# Patient Record
Sex: Male | Born: 1980 | Race: White | Hispanic: No | Marital: Single | Smoking: Current every day smoker
Health system: Southern US, Community
[De-identification: ages and names within clinical notes are randomized; demographics above are authoritative.]

## PROBLEM LIST (undated history)

## (undated) HISTORY — PX: KNEE SURGERY: SHX244

---

## 2013-04-17 ENCOUNTER — Encounter (HOSPITAL_COMMUNITY): Payer: Self-pay | Admitting: Radiology

## 2013-04-17 ENCOUNTER — Emergency Department (HOSPITAL_COMMUNITY): Payer: Medicaid Other

## 2013-04-17 ENCOUNTER — Encounter (HOSPITAL_COMMUNITY): Payer: Medicaid Other | Admitting: Anesthesiology

## 2013-04-17 ENCOUNTER — Inpatient Hospital Stay (HOSPITAL_COMMUNITY): Payer: Medicaid Other | Admitting: Anesthesiology

## 2013-04-17 ENCOUNTER — Encounter (HOSPITAL_COMMUNITY): Admission: EM | Disposition: A | Discharge status: Home Health | Payer: Self-pay | Source: Home / Self Care

## 2013-04-17 ENCOUNTER — Inpatient Hospital Stay (HOSPITAL_COMMUNITY): Payer: Medicaid Other

## 2013-04-17 ENCOUNTER — Inpatient Hospital Stay (HOSPITAL_COMMUNITY)
Admission: EM | Admit: 2013-04-17 | Discharge: 2013-04-19 | DRG: 956 | Disposition: A | Discharge status: Home Health | Payer: Medicaid Other | Attending: Orthopedic Surgery | Admitting: Orthopedic Surgery

## 2013-04-17 DIAGNOSIS — S064X9A Epidural hemorrhage with loss of consciousness of unspecified duration, initial encounter: Secondary | ICD-10-CM

## 2013-04-17 DIAGNOSIS — S065XAA Traumatic subdural hemorrhage with loss of consciousness status unknown, initial encounter: Secondary | ICD-10-CM | POA: Diagnosis present

## 2013-04-17 DIAGNOSIS — F1210 Cannabis abuse, uncomplicated: Secondary | ICD-10-CM | POA: Diagnosis present

## 2013-04-17 DIAGNOSIS — S0181XA Laceration without foreign body of other part of head, initial encounter: Secondary | ICD-10-CM

## 2013-04-17 DIAGNOSIS — K029 Dental caries, unspecified: Secondary | ICD-10-CM | POA: Diagnosis present

## 2013-04-17 DIAGNOSIS — S069XAA Unspecified intracranial injury with loss of consciousness status unknown, initial encounter: Secondary | ICD-10-CM

## 2013-04-17 DIAGNOSIS — S02401A Maxillary fracture, unspecified, initial encounter for closed fracture: Secondary | ICD-10-CM | POA: Diagnosis present

## 2013-04-17 DIAGNOSIS — S0292XA Unspecified fracture of facial bones, initial encounter for closed fracture: Secondary | ICD-10-CM

## 2013-04-17 DIAGNOSIS — S069X9A Unspecified intracranial injury with loss of consciousness of unspecified duration, initial encounter: Secondary | ICD-10-CM

## 2013-04-17 DIAGNOSIS — S5291XA Unspecified fracture of right forearm, initial encounter for closed fracture: Secondary | ICD-10-CM

## 2013-04-17 DIAGNOSIS — S5290XA Unspecified fracture of unspecified forearm, initial encounter for closed fracture: Secondary | ICD-10-CM | POA: Diagnosis present

## 2013-04-17 DIAGNOSIS — S72309A Unspecified fracture of shaft of unspecified femur, initial encounter for closed fracture: Principal | ICD-10-CM | POA: Diagnosis present

## 2013-04-17 DIAGNOSIS — S02400A Malar fracture unspecified, initial encounter for closed fracture: Secondary | ICD-10-CM | POA: Diagnosis present

## 2013-04-17 DIAGNOSIS — F17200 Nicotine dependence, unspecified, uncomplicated: Secondary | ICD-10-CM | POA: Diagnosis present

## 2013-04-17 DIAGNOSIS — S02109A Fracture of base of skull, unspecified side, initial encounter for closed fracture: Secondary | ICD-10-CM | POA: Diagnosis present

## 2013-04-17 DIAGNOSIS — S7290XA Unspecified fracture of unspecified femur, initial encounter for closed fracture: Secondary | ICD-10-CM

## 2013-04-17 DIAGNOSIS — S06309A Unspecified focal traumatic brain injury with loss of consciousness of unspecified duration, initial encounter: Secondary | ICD-10-CM

## 2013-04-17 DIAGNOSIS — S020XXA Fracture of vault of skull, initial encounter for closed fracture: Secondary | ICD-10-CM

## 2013-04-17 DIAGNOSIS — S064XAA Epidural hemorrhage with loss of consciousness status unknown, initial encounter: Secondary | ICD-10-CM

## 2013-04-17 DIAGNOSIS — S7291XA Unspecified fracture of right femur, initial encounter for closed fracture: Secondary | ICD-10-CM

## 2013-04-17 DIAGNOSIS — S064X0A Epidural hemorrhage without loss of consciousness, initial encounter: Secondary | ICD-10-CM

## 2013-04-17 DIAGNOSIS — IMO0002 Reserved for concepts with insufficient information to code with codable children: Secondary | ICD-10-CM

## 2013-04-17 DIAGNOSIS — S0291XA Unspecified fracture of skull, initial encounter for closed fracture: Secondary | ICD-10-CM

## 2013-04-17 DIAGNOSIS — S01409A Unspecified open wound of unspecified cheek and temporomandibular area, initial encounter: Secondary | ICD-10-CM | POA: Diagnosis present

## 2013-04-17 DIAGNOSIS — W19XXXA Unspecified fall, initial encounter: Secondary | ICD-10-CM | POA: Diagnosis present

## 2013-04-17 DIAGNOSIS — S0101XA Laceration without foreign body of scalp, initial encounter: Secondary | ICD-10-CM

## 2013-04-17 HISTORY — PX: FEMUR IM NAIL: SHX1597

## 2013-04-17 HISTORY — PX: ORIF RADIAL FRACTURE: SHX5113

## 2013-04-17 LAB — POCT I-STAT, CHEM 8
BUN: 14 mg/dL (ref 6–23)
Calcium, Ion: 1.05 mmol/L — ABNORMAL LOW (ref 1.12–1.23)
Chloride: 105 mEq/L (ref 96–112)
Creatinine, Ser: 0.9 mg/dL (ref 0.50–1.35)
Glucose, Bld: 160 mg/dL — ABNORMAL HIGH (ref 70–99)
HCT: 40 % (ref 39.0–52.0)
Hemoglobin: 13.6 g/dL (ref 13.0–17.0)
Potassium: 3.5 mEq/L — ABNORMAL LOW (ref 3.7–5.3)
SODIUM: 141 meq/L (ref 137–147)
TCO2: 23 mmol/L (ref 0–100)

## 2013-04-17 LAB — COMPREHENSIVE METABOLIC PANEL
ALBUMIN: 3.7 g/dL (ref 3.5–5.2)
ALT: 16 U/L (ref 0–53)
AST: 30 U/L (ref 0–37)
Alkaline Phosphatase: 66 U/L (ref 39–117)
BUN: 13 mg/dL (ref 6–23)
CHLORIDE: 104 meq/L (ref 96–112)
CO2: 22 mEq/L (ref 19–32)
CREATININE: 0.81 mg/dL (ref 0.50–1.35)
Calcium: 8.1 mg/dL — ABNORMAL LOW (ref 8.4–10.5)
GFR calc Af Amer: >90 mL/min (ref >90)
GFR calc non Af Amer: >90 mL/min (ref >90)
Glucose, Bld: 159 mg/dL — ABNORMAL HIGH (ref 70–99)
Potassium: 3.5 mEq/L — ABNORMAL LOW (ref 3.7–5.3)
Sodium: 140 mEq/L (ref 137–147)
Total Protein: 6.3 g/dL (ref 6.0–8.3)

## 2013-04-17 LAB — CBC
HEMATOCRIT: 36.7 % — AB (ref 39.0–52.0)
Hemoglobin: 13.2 g/dL (ref 13.0–17.0)
MCH: 31.7 pg (ref 26.0–34.0)
MCHC: 36 g/dL (ref 30.0–36.0)
MCV: 88 fL (ref 78.0–100.0)
Platelets: 256 10*3/uL (ref 150–400)
RBC: 4.17 MIL/uL — ABNORMAL LOW (ref 4.22–5.81)
RDW: 12.5 % (ref 11.5–15.5)
WBC: 23.5 10*3/uL — ABNORMAL HIGH (ref 4.0–10.5)

## 2013-04-17 LAB — SAMPLE TO BLOOD BANK

## 2013-04-17 LAB — PROTIME-INR
INR: 1.37 (ref 0.00–1.49)
Prothrombin Time: 16.5 seconds — ABNORMAL HIGH (ref 11.6–15.2)

## 2013-04-17 LAB — CG4 I-STAT (LACTIC ACID): LACTIC ACID, VENOUS: 0.84 mmol/L (ref 0.5–2.2)

## 2013-04-17 SURGERY — INSERTION, INTRAMEDULLARY ROD, FEMUR
Anesthesia: General | Site: Leg Upper | Laterality: Right

## 2013-04-17 MED ORDER — METOCLOPRAMIDE HCL 5 MG/ML IJ SOLN: 5.0000 mg | Freq: Three times a day (TID) | INTRAMUSCULAR | Status: DC | PRN

## 2013-04-17 MED ORDER — ARTIFICIAL TEARS OP OINT
TOPICAL_OINTMENT | OPHTHALMIC | Status: DC | PRN
  Administered 2013-04-17: 1 via OPHTHALMIC

## 2013-04-17 MED ORDER — SODIUM CHLORIDE 0.9 % IV SOLN
INTRAVENOUS | Status: DC | PRN
  Administered 2013-04-17: 05:00:00 via INTRAVENOUS

## 2013-04-17 MED ORDER — SUCCINYLCHOLINE CHLORIDE 20 MG/ML IJ SOLN
INTRAMUSCULAR | Status: DC | PRN
  Administered 2013-04-17: 140 mg via INTRAVENOUS

## 2013-04-17 MED ORDER — ONDANSETRON HCL 4 MG/2ML IJ SOLN
4.0000 mg | Freq: Four times a day (QID) | INTRAMUSCULAR | Status: DC | PRN
  Administered 2013-04-17: 4 mg via INTRAVENOUS
  Filled 2013-04-17: qty 2

## 2013-04-17 MED ORDER — ONDANSETRON HCL 4 MG PO TABS: 4.0000 mg | ORAL_TABLET | Freq: Four times a day (QID) | ORAL | Status: DC | PRN

## 2013-04-17 MED ORDER — WARFARIN - PHARMACIST DOSING INPATIENT: Freq: Every day | Status: DC

## 2013-04-17 MED ORDER — CEFAZOLIN SODIUM-DEXTROSE 2-3 GM-% IV SOLR
INTRAVENOUS | Status: AC
  Filled 2013-04-17: qty 50

## 2013-04-17 MED ORDER — DEXTROSE 5 % IV SOLN
INTRAVENOUS | Status: DC | PRN
  Administered 2013-04-17: 05:00:00 via INTRAVENOUS

## 2013-04-17 MED ORDER — WARFARIN VIDEO: Freq: Once | Status: DC

## 2013-04-17 MED ORDER — OXYCODONE HCL 5 MG/5ML PO SOLN: 5.0000 mg | Freq: Once | ORAL | Status: AC | PRN

## 2013-04-17 MED ORDER — LIDOCAINE HCL (CARDIAC) 20 MG/ML IV SOLN
INTRAVENOUS | Status: DC | PRN
  Administered 2013-04-17: 100 mg via INTRAVENOUS

## 2013-04-17 MED ORDER — ONDANSETRON HCL 4 MG/2ML IJ SOLN
INTRAMUSCULAR | Status: AC
  Administered 2013-04-17: 4 mg
  Filled 2013-04-17: qty 2

## 2013-04-17 MED ORDER — FENTANYL CITRATE 0.05 MG/ML IJ SOLN
50.0000 ug | INTRAMUSCULAR | Status: DC | PRN
  Administered 2013-04-17 (×3): 50 ug via INTRAVENOUS
  Filled 2013-04-17: qty 2

## 2013-04-17 MED ORDER — CEFAZOLIN SODIUM-DEXTROSE 2-3 GM-% IV SOLR
INTRAVENOUS | Status: DC | PRN
  Administered 2013-04-17: 2 g via INTRAVENOUS

## 2013-04-17 MED ORDER — ONDANSETRON HCL 4 MG/2ML IJ SOLN
INTRAMUSCULAR | Status: DC | PRN
  Administered 2013-04-17: 4 mg via INTRAVENOUS

## 2013-04-17 MED ORDER — COUMADIN BOOK
Freq: Once | Status: DC
  Filled 2013-04-17: qty 1

## 2013-04-17 MED ORDER — HYDROMORPHONE HCL PF 1 MG/ML IJ SOLN: 1.0000 mg | INTRAMUSCULAR | Status: DC | PRN

## 2013-04-17 MED ORDER — ONDANSETRON HCL 4 MG/2ML IJ SOLN: 4.0000 mg | Freq: Four times a day (QID) | INTRAMUSCULAR | Status: DC | PRN

## 2013-04-17 MED ORDER — LACTATED RINGERS IV SOLN
INTRAVENOUS | Status: DC | PRN
  Administered 2013-04-17: 07:00:00 via INTRAVENOUS

## 2013-04-17 MED ORDER — HYDROMORPHONE HCL PF 1 MG/ML IJ SOLN
INTRAMUSCULAR | Status: AC
Start: 2013-04-17 — End: 2013-04-17
  Administered 2013-04-17: 0.5 mg via INTRAVENOUS
  Filled 2013-04-17: qty 1

## 2013-04-17 MED ORDER — CEFAZOLIN SODIUM 1-5 GM-% IV SOLN
1.0000 g | Freq: Four times a day (QID) | INTRAVENOUS | Status: AC
  Administered 2013-04-17 (×3): 1 g via INTRAVENOUS
  Filled 2013-04-17 (×4): qty 50

## 2013-04-17 MED ORDER — OXYCODONE HCL 5 MG PO TABS
5.0000 mg | ORAL_TABLET | Freq: Once | ORAL | Status: AC | PRN
  Administered 2013-04-17: 5 mg via ORAL

## 2013-04-17 MED ORDER — BUPIVACAINE HCL (PF) 0.5 % IJ SOLN
INTRAMUSCULAR | Status: AC
  Filled 2013-04-17: qty 10

## 2013-04-17 MED ORDER — IOHEXOL 300 MG/ML  SOLN
100.0000 mL | Freq: Once | INTRAMUSCULAR | Status: AC | PRN
  Administered 2013-04-17: 100 mL via INTRAVENOUS

## 2013-04-17 MED ORDER — PROPOFOL 10 MG/ML IV BOLUS
INTRAVENOUS | Status: DC | PRN
  Administered 2013-04-17 (×2): 30 mg via INTRAVENOUS
  Administered 2013-04-17: 120 mg via INTRAVENOUS

## 2013-04-17 MED ORDER — SODIUM CHLORIDE 0.9 % IV BOLUS (SEPSIS)
125.0000 mL | Freq: Once | INTRAVENOUS | Status: AC
  Administered 2013-04-17: 125 mL via INTRAVENOUS

## 2013-04-17 MED ORDER — VECURONIUM BROMIDE 10 MG IV SOLR
INTRAVENOUS | Status: DC | PRN
  Administered 2013-04-17: 6 mg via INTRAVENOUS
  Administered 2013-04-17: 2 mg via INTRAVENOUS

## 2013-04-17 MED ORDER — ONDANSETRON HCL 4 MG/2ML IJ SOLN: 4.0000 mg | Freq: Once | INTRAMUSCULAR | Status: DC | PRN

## 2013-04-17 MED ORDER — BUPIVACAINE HCL (PF) 0.25 % IJ SOLN
INTRAMUSCULAR | Status: DC | PRN
Start: 2013-04-17 — End: 2013-04-17
  Administered 2013-04-17: 10 mL

## 2013-04-17 MED ORDER — TETANUS-DIPHTH-ACELL PERTUSSIS 5-2.5-18.5 LF-MCG/0.5 IM SUSP
0.5000 mL | Freq: Once | INTRAMUSCULAR | Status: AC
  Administered 2013-04-17: 0.5 mL via INTRAMUSCULAR
  Filled 2013-04-17: qty 0.5

## 2013-04-17 MED ORDER — GLYCOPYRROLATE 0.2 MG/ML IJ SOLN
INTRAMUSCULAR | Status: DC | PRN
  Administered 2013-04-17: 0.4 mg via INTRAVENOUS

## 2013-04-17 MED ORDER — WARFARIN SODIUM 5 MG PO TABS
5.0000 mg | ORAL_TABLET | Freq: Once | ORAL | Status: DC
  Filled 2013-04-17: qty 1

## 2013-04-17 MED ORDER — POTASSIUM CHLORIDE IN NACL 20-0.45 MEQ/L-% IV SOLN
INTRAVENOUS | Status: DC
  Administered 2013-04-17: 100 mL/h via INTRAVENOUS
  Administered 2013-04-17 – 2013-04-18 (×2): via INTRAVENOUS
  Filled 2013-04-17 (×5): qty 1000

## 2013-04-17 MED ORDER — NEOSTIGMINE METHYLSULFATE 1 MG/ML IJ SOLN
INTRAMUSCULAR | Status: DC | PRN
  Administered 2013-04-17: 2 mg via INTRAVENOUS

## 2013-04-17 MED ORDER — BUPIVACAINE HCL (PF) 0.25 % IJ SOLN
INTRAMUSCULAR | Status: AC
  Filled 2013-04-17: qty 30

## 2013-04-17 MED ORDER — PANTOPRAZOLE SODIUM 40 MG IV SOLR
40.0000 mg | Freq: Every day | INTRAVENOUS | Status: DC
  Administered 2013-04-17: 40 mg via INTRAVENOUS
  Filled 2013-04-17 (×4): qty 40

## 2013-04-17 MED ORDER — DEXAMETHASONE SODIUM PHOSPHATE 10 MG/ML IJ SOLN
INTRAMUSCULAR | Status: DC | PRN
  Administered 2013-04-17: 8 mg via INTRAVENOUS

## 2013-04-17 MED ORDER — TETANUS-DIPHTHERIA TOXOIDS TD 5-2 LFU IM INJ: 0.5000 mL | INJECTION | Freq: Once | INTRAMUSCULAR | Status: DC

## 2013-04-17 MED ORDER — MEPERIDINE HCL 25 MG/ML IJ SOLN: 6.2500 mg | INTRAMUSCULAR | Status: DC | PRN

## 2013-04-17 MED ORDER — OXYCODONE HCL 5 MG PO TABS
ORAL_TABLET | ORAL | Status: AC
Start: 2013-04-17 — End: 2013-04-17
  Administered 2013-04-17: 5 mg via ORAL
  Filled 2013-04-17: qty 1

## 2013-04-17 MED ORDER — SODIUM CHLORIDE 0.9 % IV SOLN: INTRAVENOUS | Status: DC

## 2013-04-17 MED ORDER — PANTOPRAZOLE SODIUM 40 MG PO TBEC
40.0000 mg | DELAYED_RELEASE_TABLET | Freq: Every day | ORAL | Status: DC
  Administered 2013-04-18 – 2013-04-19 (×2): 40 mg via ORAL
  Filled 2013-04-17 (×2): qty 1

## 2013-04-17 MED ORDER — FENTANYL CITRATE 0.05 MG/ML IJ SOLN
INTRAMUSCULAR | Status: AC
  Administered 2013-04-17: 50 ug via INTRAVENOUS
  Filled 2013-04-17: qty 2

## 2013-04-17 MED ORDER — HYDROMORPHONE HCL PF 1 MG/ML IJ SOLN
0.2500 mg | INTRAMUSCULAR | Status: DC | PRN
  Administered 2013-04-17 (×2): 0.5 mg via INTRAVENOUS

## 2013-04-17 MED ORDER — FENTANYL CITRATE 0.05 MG/ML IJ SOLN
INTRAMUSCULAR | Status: DC | PRN
  Administered 2013-04-17: 100 ug via INTRAVENOUS
  Administered 2013-04-17: 150 ug via INTRAVENOUS
  Administered 2013-04-17 (×3): 50 ug via INTRAVENOUS
  Administered 2013-04-17 (×2): 100 ug via INTRAVENOUS

## 2013-04-17 MED ORDER — 0.9 % SODIUM CHLORIDE (POUR BTL) OPTIME
TOPICAL | Status: DC | PRN
  Administered 2013-04-17: 120 mL
  Administered 2013-04-17: 200 mL

## 2013-04-17 MED ORDER — METOCLOPRAMIDE HCL 10 MG PO TABS: 5.0000 mg | ORAL_TABLET | Freq: Three times a day (TID) | ORAL | Status: DC | PRN

## 2013-04-17 MED ORDER — MIDAZOLAM HCL 5 MG/5ML IJ SOLN
INTRAMUSCULAR | Status: DC | PRN
  Administered 2013-04-17: 2 mg via INTRAVENOUS

## 2013-04-17 MED ORDER — OXYCODONE-ACETAMINOPHEN 5-325 MG PO TABS
1.0000 | ORAL_TABLET | ORAL | Status: DC | PRN
  Administered 2013-04-17: 1 via ORAL
  Administered 2013-04-17 – 2013-04-18 (×2): 2 via ORAL
  Administered 2013-04-18 (×2): 1 via ORAL
  Administered 2013-04-18: 2 via ORAL
  Administered 2013-04-19: 1 via ORAL
  Filled 2013-04-17 (×2): qty 1
  Filled 2013-04-17 (×3): qty 2
  Filled 2013-04-17 (×2): qty 1

## 2013-04-17 SURGICAL SUPPLY — 84 items
BANDAGE ELASTIC 3 VELCRO ST LF (GAUZE/BANDAGES/DRESSINGS) ×4 IMPLANT
BANDAGE ELASTIC 4 VELCRO ST LF (GAUZE/BANDAGES/DRESSINGS) ×4 IMPLANT
BANDAGE GAUZE ELAST BULKY 4 IN (GAUZE/BANDAGES/DRESSINGS) ×8 IMPLANT
BIT DRILL 2.5X2.75 QC CALB (BIT) ×3 IMPLANT
BIT DRILL LONG 4.0 (BIT) ×1 IMPLANT
BIT DRILL SHORT 4.0 (BIT) ×1 IMPLANT
BLADE SURG 15 STRL LF DISP TIS (BLADE) ×2 IMPLANT
BLADE SURG 15 STRL SS (BLADE) ×4
BNDG CMPR 9X4 STRL LF SNTH (GAUZE/BANDAGES/DRESSINGS) ×2
BNDG ESMARK 4X9 LF (GAUZE/BANDAGES/DRESSINGS) ×4 IMPLANT
BNDG GAUZE ELAST 4 BULKY (GAUZE/BANDAGES/DRESSINGS) ×2 IMPLANT
CLOSURE WOUND 1/2 X4 (GAUZE/BANDAGES/DRESSINGS)
CLOTH BEACON ORANGE TIMEOUT ST (SAFETY) ×8 IMPLANT
CORDS BIPOLAR (ELECTRODE) IMPLANT
COVER SURGICAL LIGHT HANDLE (MISCELLANEOUS) ×8 IMPLANT
COVER TABLE BACK 60X90 (DRAPES) ×4 IMPLANT
CUFF TOURNIQUET SINGLE 18IN (TOURNIQUET CUFF) ×4 IMPLANT
DRAPE C-ARM 42X72 X-RAY (DRAPES) ×4 IMPLANT
DRAPE OEC MINIVIEW 54X84 (DRAPES) IMPLANT
DRAPE STERI IOBAN 125X83 (DRAPES) ×4 IMPLANT
DRAPE SURG 17X23 STRL (DRAPES) ×4 IMPLANT
DRILL BIT LONG 4.0 (BIT) ×4
DRILL BIT SHORT 4.0 (BIT) ×4
DRSG ADAPTIC 3X8 NADH LF (GAUZE/BANDAGES/DRESSINGS) ×4 IMPLANT
DRSG MEPILEX BORDER 4X4 (GAUZE/BANDAGES/DRESSINGS) ×4 IMPLANT
DRSG MEPILEX BORDER 4X8 (GAUZE/BANDAGES/DRESSINGS) ×4 IMPLANT
DRSG PAD ABDOMINAL 8X10 ST (GAUZE/BANDAGES/DRESSINGS) ×3 IMPLANT
DURAPREP 26ML APPLICATOR (WOUND CARE) ×4 IMPLANT
ELECT REM PT RETURN 9FT ADLT (ELECTROSURGICAL) ×4
ELECTRODE REM PT RTRN 9FT ADLT (ELECTROSURGICAL) ×2 IMPLANT
EVACUATOR 1/8 PVC DRAIN (DRAIN) IMPLANT
GAUZE XEROFORM 1X8 LF (GAUZE/BANDAGES/DRESSINGS) ×4 IMPLANT
GAUZE XEROFORM 5X9 LF (GAUZE/BANDAGES/DRESSINGS) ×3 IMPLANT
GLOVE BIO SURGEON STRL SZ8.5 (GLOVE) ×4 IMPLANT
GLOVE BIOGEL PI IND STRL 9 (GLOVE) ×2 IMPLANT
GLOVE BIOGEL PI INDICATOR 9 (GLOVE) ×2
GLOVE SURG ORTHO 9.0 STRL STRW (GLOVE) ×4 IMPLANT
GOWN PREVENTION PLUS XLARGE (GOWN DISPOSABLE) ×4 IMPLANT
GOWN SRG XL XLNG 56XLVL 4 (GOWN DISPOSABLE) ×6 IMPLANT
GOWN STRL NON-REIN LRG LVL3 (GOWN DISPOSABLE) ×4 IMPLANT
GOWN STRL NON-REIN XL XLG LVL4 (GOWN DISPOSABLE) ×12
GUIDE PIN 3.2MM (MISCELLANEOUS) ×4
GUIDE PIN ORTH 343X3.2XBRAD (MISCELLANEOUS) ×1 IMPLANT
GUIDE ROD 3.0 (MISCELLANEOUS) ×4
KIT BASIN OR (CUSTOM PROCEDURE TRAY) ×8 IMPLANT
KIT ROOM TURNOVER OR (KITS) ×8 IMPLANT
MANIFOLD NEPTUNE II (INSTRUMENTS) ×8 IMPLANT
NAIL TAN R 10X38 (Nail) ×3 IMPLANT
NDL HYPO 25GX1X1/2 BEV (NEEDLE) IMPLANT
NEEDLE HYPO 25GX1X1/2 BEV (NEEDLE) IMPLANT
NS IRRIG 1000ML POUR BTL (IV SOLUTION) ×8 IMPLANT
PACK GENERAL/GYN (CUSTOM PROCEDURE TRAY) ×4 IMPLANT
PACK ORTHO EXTREMITY (CUSTOM PROCEDURE TRAY) ×4 IMPLANT
PAD ARMBOARD 7.5X6 YLW CONV (MISCELLANEOUS) ×16 IMPLANT
PAD CAST 3X4 CTTN HI CHSV (CAST SUPPLIES) ×2 IMPLANT
PAD CAST 4YDX4 CTTN HI CHSV (CAST SUPPLIES) ×2 IMPLANT
PADDING CAST ABS 4INX4YD NS (CAST SUPPLIES) ×2
PADDING CAST ABS COTTON 4X4 ST (CAST SUPPLIES) IMPLANT
PADDING CAST COTTON 3X4 STRL (CAST SUPPLIES) ×4
PADDING CAST COTTON 4X4 STRL (CAST SUPPLIES) ×4
PENCIL BUTTON HOLSTER BLD 10FT (ELECTRODE) IMPLANT
PLATE LOCK COMP 6H FOOT (Plate) ×2 IMPLANT
ROD GUIDE 3.0 (MISCELLANEOUS) ×1 IMPLANT
SCREW CORTICAL 3.5MM  16MM (Screw) ×4 IMPLANT
SCREW CORTICAL 3.5MM 16MM (Screw) IMPLANT
SCREW CORTICAL 3.5MM 18MM (Screw) ×12 IMPLANT
SCREW TRIGEN LOW PROF 5.0X35 (Screw) ×3 IMPLANT
SCREW TRIGEN LOW PROF 5.0X60 (Screw) ×2 IMPLANT
SPLINT FIBERGLASS 4X15 (CAST SUPPLIES) ×2 IMPLANT
SPONGE GAUZE 4X4 12PLY (GAUZE/BANDAGES/DRESSINGS) ×4 IMPLANT
SPONGE LAP 4X18 X RAY DECT (DISPOSABLE) IMPLANT
STAPLER VISISTAT 35W (STAPLE) ×2 IMPLANT
STRIP CLOSURE SKIN 1/2X4 (GAUZE/BANDAGES/DRESSINGS) IMPLANT
SUT PROLENE 3 0 PS 2 (SUTURE) IMPLANT
SUT VIC AB 2-0 CTB1 (SUTURE) IMPLANT
SUT VIC AB 2-0 FS1 27 (SUTURE) ×2 IMPLANT
SUT VICRYL 4-0 PS2 18IN ABS (SUTURE) IMPLANT
SYR CONTROL 10ML LL (SYRINGE) IMPLANT
TOWEL OR 17X24 6PK STRL BLUE (TOWEL DISPOSABLE) ×4 IMPLANT
TOWEL OR 17X26 10 PK STRL BLUE (TOWEL DISPOSABLE) ×4 IMPLANT
TUBE CONNECTING 12'X1/4 (SUCTIONS)
TUBE CONNECTING 12X1/4 (SUCTIONS) IMPLANT
UNDERPAD 30X30 INCONTINENT (UNDERPADS AND DIAPERS) ×4 IMPLANT
WATER STERILE IRR 1000ML POUR (IV SOLUTION) ×12 IMPLANT
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
## 2013-04-17 NOTE — Transfer of Care (Signed)
Immediate Anesthesia Transfer of Care Note  Patient: Lucas Acosta  Procedure(s) Performed: Procedure(s): INTRAMEDULLARY (IM) NAIL FEMORAL (Right) OPEN REDUCTION INTERNAL FIXATION (ORIF) RADIAL FRACTURE (Right)  Patient Location: PACU  Anesthesia Type:General  Level of Consciousness: sedated  Airway & Oxygen Therapy: Patient Spontanous Breathing and Patient connected to nasal cannula oxygen  Post-op Assessment: Report given to PACU RN and Post -op Vital signs reviewed and stable  Post vital signs: Reviewed and stable  Complications: No apparent anesthesia complications

## 2013-04-17 NOTE — Preoperative (Signed)
Beta Blockers   Reason not to administer Beta Blockers:Not Applicable 
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
## 2013-04-17 NOTE — ED Provider Notes (Signed)
Lucas Acosta S 3:00 AM patient discussed with attending physician. I will assist in patient care by repairing facial lacerations.  LACERATION REPAIR Performed by: Angus SellerAMMEN,Nancye Grumbine S Authorized by: Angus SellerAMMEN,Generoso Cropper S Consent: Verbal consent obtained. Risks and benefits: risks, benefits and alternatives were discussed Consent given by: patient Patient identity confirmed: provided demographic data Prepped and Draped in normal sterile fashion Wound explored  Laceration Location: Right cheek  Laceration Length: 2.5 cm  No Foreign Bodies seen or palpated  Anesthesia: local infiltration  Local anesthetic: lidocaine 2% with epinephrine  Anesthetic total: 2 ml  Irrigation method: syringe Amount of cleaning: standard  Skin closure: Skin with 5-0 Prolene   Number of sutures: 4   Technique: Simple interrupted   Patient tolerance: Patient tolerated the procedure well with no immediate complications.   LACERATION REPAIR Performed by: Angus SellerAMMEN,Matheu Ploeger S Authorized by: Angus SellerAMMEN,Jett Kulzer S Consent: Verbal consent obtained. Risks and benefits: risks, benefits and alternatives were discussed Consent given by: patient Patient identity confirmed: provided demographic data Prepped and Draped in normal sterile fashion Wound explored  Laceration Location: Chin  Laceration Length: 2 cm  No Foreign Bodies seen or palpated  Anesthesia: local infiltration  Local anesthetic: lidocaine 2% with epinephrine  Anesthetic total: 3 ml  Irrigation method: syringe Amount of cleaning: standard  Skin closure: Skin with 5-0 Prolene   Number of sutures: 3   Technique: Simple interrupted   Patient tolerance: Patient tolerated the procedure well with no immediate complications.   Angus SellerPeter S Farrell Pantaleo, PA-C 04/17/13 [id number]
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
## 2013-04-17 NOTE — Anesthesia Procedure Notes (Signed)
Procedure Name: Intubation Date/Time: 04/17/2013 5:40 AM Performed by: Wray KearnsFOLEY, Heman Que A Pre-anesthesia Checklist: Patient identified, Timeout performed, Emergency Drugs available, Suction available and Patient being monitored Patient Re-evaluated:Patient Re-evaluated prior to inductionOxygen Delivery Method: Circle system utilized Preoxygenation: Pre-oxygenation with 100% oxygen Intubation Type: IV induction, Rapid sequence and Cricoid Pressure applied Laryngoscope Size: Mac Grade View: Grade I Tube type: Oral Tube size: 7.5 mm Number of attempts: 1 Airway Equipment and Method: Stylet and Video-laryngoscopy Placement Confirmation: ETT inserted through vocal cords under direct vision,  breath sounds checked- equal and bilateral and positive ETCO2 Secured at: 22 cm Tube secured with: Tape Dental Injury: Teeth and Oropharynx as per pre-operative assessment  Difficulty Due To: Difficulty was anticipated, Difficult Airway- due to reduced neck mobility, Difficult Airway- due to cervical collar and Difficult Airway-  due to neck instability

## 2013-04-17 NOTE — Op Note (Signed)
See note [id number]

## 2013-04-17 NOTE — Anesthesia Postprocedure Evaluation (Signed)
  Anesthesia Post-op Note  Patient: Lucas Acosta  Procedure(s) Performed: Procedure(s): INTRAMEDULLARY (IM) NAIL FEMORAL (Right) OPEN REDUCTION INTERNAL FIXATION (ORIF) RADIAL FRACTURE (Right)  Patient Location: PACU  Anesthesia Type:General  Level of Consciousness: awake and sedated  Airway and Oxygen Therapy: Patient Spontanous Breathing  Post-op Pain: mild  Post-op Assessment: Post-op Vital signs reviewed  Post-op Vital Signs: stable  Complications: No apparent anesthesia complications

## 2013-04-17 NOTE — ED Provider Notes (Signed)
Medical screening examination/treatment/procedure(s) were conducted as a shared visit with non-physician practitioner(s) and myself.  I personally evaluated the patient during the encounter.    Sunnie NielsenBrian Zaleigh Bermingham, MD 04/17/13 [phone]
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
## 2013-04-17 NOTE — ED Notes (Signed)
Neuro at [facility].
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
## 2013-04-17 NOTE — ED Notes (Signed)
[facility]
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
## 2013-04-17 NOTE — Anesthesia Preprocedure Evaluation (Signed)
Anesthesia Evaluation  Patient identified by MRN, date of birth, ID band Patient awake    Reviewed: Allergy & Precautions, H&P , NPO status , Patient's Chart, lab work & pertinent test results  Airway Mallampati: II TM Distance: >3 FB Neck ROM: Full    Dental   Pulmonary Current Smoker,          Cardiovascular     Neuro/Psych    GI/Hepatic   Endo/Other    Renal/GU      Musculoskeletal   Abdominal   Peds  Hematology   Anesthesia Other Findings   Reproductive/Obstetrics                           Anesthesia Physical Anesthesia Plan  ASA: III and emergent  Anesthesia Plan: General   Post-op Pain Management:    Induction: Intravenous, Rapid sequence and Cricoid pressure planned  Airway Management Planned: Oral ETT  Additional Equipment:   Intra-op Plan:   Post-operative Plan: Extubation in OR  Informed Consent: I have reviewed the patients History and Physical, chart, labs and discussed the procedure including the risks, benefits and alternatives for the proposed anesthesia with the patient or authorized representative who has indicated his/her understanding and acceptance.     Plan Discussed with: CRNA and Surgeon  Anesthesia Plan Comments:         Anesthesia Quick Evaluation
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
## 2013-04-17 NOTE — Progress Notes (Signed)
Contraindicated for patient to get coumadin with [facility].  Will discontinue.  Marta LamasJames O. Gae BonWyatt, III, MD, FACS [phone] Trauma Surgeon

## 2013-04-18 ENCOUNTER — Encounter (HOSPITAL_COMMUNITY): Payer: Self-pay | Admitting: Orthopedic Surgery

## 2013-04-18 ENCOUNTER — Inpatient Hospital Stay (HOSPITAL_COMMUNITY): Payer: Medicaid Other

## 2013-04-18 DIAGNOSIS — S069X9A Unspecified intracranial injury with loss of consciousness of unspecified duration, initial encounter: Secondary | ICD-10-CM

## 2013-04-18 DIAGNOSIS — S064X9A Epidural hemorrhage with loss of consciousness of unspecified duration, initial encounter: Secondary | ICD-10-CM

## 2013-04-18 DIAGNOSIS — S0181XA Laceration without foreign body of other part of head, initial encounter: Secondary | ICD-10-CM

## 2013-04-18 DIAGNOSIS — S069XAA Unspecified intracranial injury with loss of consciousness status unknown, initial encounter: Secondary | ICD-10-CM

## 2013-04-18 DIAGNOSIS — S0101XA Laceration without foreign body of scalp, initial encounter: Secondary | ICD-10-CM

## 2013-04-18 DIAGNOSIS — K029 Dental caries, unspecified: Secondary | ICD-10-CM | POA: Diagnosis present

## 2013-04-18 DIAGNOSIS — S064XAA Epidural hemorrhage with loss of consciousness status unknown, initial encounter: Secondary | ICD-10-CM

## 2013-04-18 DIAGNOSIS — S0292XA Unspecified fracture of facial bones, initial encounter for closed fracture: Secondary | ICD-10-CM

## 2013-04-18 DIAGNOSIS — S5291XA Unspecified fracture of right forearm, initial encounter for closed fracture: Secondary | ICD-10-CM

## 2013-04-18 LAB — CBC
HEMATOCRIT: 31.5 % — AB (ref 39.0–52.0)
HEMOGLOBIN: 11.4 g/dL — AB (ref 13.0–17.0)
MCH: 31.5 pg (ref 26.0–34.0)
MCHC: 36.2 g/dL — AB (ref 30.0–36.0)
MCV: 87 fL (ref 78.0–100.0)
Platelets: 227 10*3/uL (ref 150–400)
RBC: 3.62 MIL/uL — ABNORMAL LOW (ref 4.22–5.81)
RDW: 12.6 % (ref 11.5–15.5)
WBC: 9.1 10*3/uL (ref 4.0–10.5)

## 2013-04-18 LAB — BASIC METABOLIC PANEL
BUN: 10 mg/dL (ref 6–23)
CALCIUM: 8.6 mg/dL (ref 8.4–10.5)
CO2: 25 mEq/L (ref 19–32)
Chloride: 108 mEq/L (ref 96–112)
Creatinine, Ser: 0.76 mg/dL (ref 0.50–1.35)
GFR calc non Af Amer: >90 mL/min (ref >90)
GLUCOSE: 118 mg/dL — AB (ref 70–99)
Potassium: 3.6 mEq/L — ABNORMAL LOW (ref 3.7–5.3)
Sodium: 145 mEq/L (ref 137–147)

## 2013-04-18 MED ORDER — MENTHOL 3 MG MT LOZG: 1.0000 | LOZENGE | OROMUCOSAL | Status: DC | PRN

## 2013-04-18 MED ORDER — DOCUSATE SODIUM 100 MG PO CAPS: 100.0000 mg | ORAL_CAPSULE | Freq: Two times a day (BID) | ORAL | Status: DC | PRN

## 2013-04-18 MED ORDER — PHENOL 1.4 % MT LIQD: 2.0000 | OROMUCOSAL | Status: DC | PRN
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
## 2013-04-18 NOTE — Progress Notes (Signed)
PT Cancellation Note  Patient Details Name: Lucas Acosta H Strite MRN: [id number] DOB: [birth year]   Cancelled Treatment:    Reason Eval/Treat Not Completed: Medical issues which prohibited therapy  Orders received, chart reviewed  Noted activity order is for keeping flat, logroll only  Will await guidance from Neurosurgery re: safe increase in activity prior to mobilizing pt, and be happy to proceed with PT eval once activity orders are increased  Thanks,  Van ClinesHolly Starlynn Klinkner, [place]PT [phone]    Van ClinesGarrigan, Wahid Holley [facility] 04/18/2013, 9:46 AM
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
## 2013-04-18 NOTE — Discharge Instructions (Signed)
Change dressing right thigh as needed

## 2013-04-18 NOTE — Progress Notes (Signed)
Patient ID: Lucas Acosta, male   DOB: 1980/07/10, 33 y.o.   MRN: [id number] Postoperative day 1 intramedullary nailing segmental right femur fracture closed. Physical therapy progressive ambulation,  patient could use a platform walker, with a platform on the right with partial weightbearing on the right lower extremity patient may be discharged from an orthopedic standpoint once he is safe with ambulation with a walker.

## 2013-04-18 NOTE — Progress Notes (Signed)
Lightheaded when he got up this PM. Patient examined and I agree with the assessment and plan  Violeta GelinasBurke Nicki Gracy, MD, MPH, FACS Pager: [phone]  04/18/2013 4:59 PM

## 2013-04-18 NOTE — Op Note (Signed)
NAMThera Flake:  Lucas Acosta, Lucas Acosta              ACCOUNT NO.:  [id number]  MEDICAL RECORD NO.:  [id number]  LOCATION:  [id number]                        FACILITY:  [facility]  PHYSICIAN:  Artist PaisMatthew A. Hakeen Shipes, M.D.DATE OF BIRTH:  11/20/1980  DATE OF PROCEDURE:  04/17/2013 DATE OF DISCHARGE:                              OPERATIVE REPORT   PREOPERATIVE DIAGNOSIS:  Midshaft radius fracture, right side.  POSTOPERATIVE DIAGNOSIS:  Midshaft radius fracture, right side.  PROCEDURE:  Open reduction and internal fixation above using precontoured 6-hole compression plate and screws.  SURGEON:  Artist PaisMatthew A. Mina MarbleWeingold, M.D.  ASSISTANT:  None.  ANESTHESIA:  General.  TOURNIQUET TIME:  An hour and 7 minutes.  COMPLICATIONS:  No complications.  DRAINS:  No drains.  DESCRIPTION OF PROCEDURE:  The patient was taken to operating suite after an IM rod had been place for his right femur fracture by Dr. Aldean BakerMarcus Duda.  The patient was then re-prepped and draped in usual sterile fashion.  Esmarch was used to exsanguinate the limb.  Tourniquet on the right side was inflated to 250 mmHg.  At this point in time, an incision was made over the midshaft area of the radius on the right side.  Skin was incised.  Interval between the radial artery and the brachioradialis was identified.  The fascia was incised.  Dissection was carried down to the muscle layers to the fracture site.  The fracture site was exposed.  There was a midshaft fracture of the radius with a large ulnarly based butterfly fragment.  We achieved reduction with longitudinal traction, muscle relaxation, and reduction clamp including butterfly fragment.  We then contoured a 6-hole DCP plate, still on the lower aspect, it was fixed with 3 screws proximal and 3 screws distal to the fracture site using appropriate compression technique. Intraoperative fluoroscopy in the AP, lateral, and oblique view showed adequate reduction. The wound was thoroughly irrigated and  closed in layers of 2-0 undyed Vicryl subcutaneously and staples on the skin.  The patient was then placed in sterile dressing, Xeroform, 4x4s, fluffs, and a volar splint. The patient tolerated the procedure well in a concealed fashion.     Artist PaisMatthew A. Mina MarbleWeingold, M.D.     MAW/MEDQ  D:  04/17/2013  T:  04/17/2013  Job:  [id number]
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
## 2013-04-19 ENCOUNTER — Encounter (HOSPITAL_COMMUNITY): Payer: Self-pay | Admitting: General Practice

## 2013-04-19 LAB — CBC
HEMATOCRIT: 28.9 % — AB (ref 39.0–52.0)
Hemoglobin: 10.5 g/dL — ABNORMAL LOW (ref 13.0–17.0)
MCH: 31.5 pg (ref 26.0–34.0)
MCHC: 36.3 g/dL — AB (ref 30.0–36.0)
MCV: 86.8 fL (ref 78.0–100.0)
Platelets: 215 10*3/uL (ref 150–400)
RBC: 3.33 MIL/uL — ABNORMAL LOW (ref 4.22–5.81)
RDW: 12.5 % (ref 11.5–15.5)
WBC: 9.6 10*3/uL (ref 4.0–10.5)

## 2013-04-19 MED ORDER — OXYCODONE-ACETAMINOPHEN 5-325 MG PO TABS: 0.5000 | ORAL_TABLET | ORAL | Status: DC | PRN

## 2013-04-19 MED ORDER — ENSURE COMPLETE PO LIQD: 237.0000 mL | Freq: Two times a day (BID) | ORAL | Status: AC

## 2013-04-19 MED ORDER — ENSURE COMPLETE PO LIQD
237.0000 mL | Freq: Two times a day (BID) | ORAL | Status: DC
  Administered 2013-04-19 (×2): 237 mL via ORAL

## 2013-04-19 MED ORDER — DSS 100 MG PO CAPS: 100.0000 mg | ORAL_CAPSULE | Freq: Two times a day (BID) | ORAL | Status: AC | PRN

## 2013-04-19 NOTE — Progress Notes (Signed)
Looks good now. Not requiring much pain medication. Speech fluent, A&O, MAE, no focal neuro deficit. I spoke to Dr. Phoebe PerchHirsch and made a plan for F/U with CT head in 2-3 weeks- prior to his F/U appointment. I also spoke to Mrs. Renaldo FiddlerAdkins. D/C this PM. Patient examined and I agree with the assessment and plan  Violeta GelinasBurke Lekesha Claw, MD, MPH, FACS Pager: [phone]  04/19/2013 1:09 PM

## 2013-04-19 NOTE — Progress Notes (Signed)
Patient ID: Lucas Acosta, male   DOB: 09-21-1980, 33 y.o.   MRN: [id number] Patient ambulated well with therapy yesterday. He did have a fall which he felt was secondary to his increased dosage of Percocet. He denies any injuries. I will followup in the office in 2 weeks.
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
## 2013-04-19 NOTE — Progress Notes (Addendum)
Best number to call is pt's cell phone and that number is [phone]. The number listed in EPIC is the house phone.  The address and house phone listed are both confirmed as correct. Pt chose [facility] for HHPT based on his insurance options. He has a friend at [facility] that will assist him in obtaining the Right Platform rolling walker. Pt denied further questions.

## 2013-04-19 NOTE — Progress Notes (Signed)
Subjective: Patient with no c/o  Objective: Vital signs in last 24 hours: Temp:  [98.8 F (37.1 C)-100.3 F (37.9 C)] 99.5 F (37.5 C) (01/13 0542) Pulse Rate:  [77-99] 99 (01/13 1006) Resp:  [18] 18 (01/13 0542) BP: (112-118)/(67-68) 118/68 mmHg (01/13 0542) SpO2:  [98 %] 98 % (01/13 1006)  Intake/Output from previous day: 01/12 0701 - 01/13 0700 In: 240 [P.O.:240] Out: -  Intake/Output this shift: Total I/O In: 240 [P.O.:240] Out: -   AAOx3  -motor intact,   ambulating  Lab Results:  Recent Labs  04/18/13 1140 04/19/13 0440  WBC 9.1 9.6  HGB 11.4* 10.5*  HCT 31.5* 28.9*  PLT 227 215   BMET  Recent Labs  04/17/13 0109 04/17/13 0113 04/18/13 1140  NA 140 141 145  K 3.5* 3.5* 3.6*  CL 104 105 108  CO2 22  --  25  GLUCOSE 159* 160* 118*  BUN 13 14 10   CREATININE 0.81 0.90 0.76  CALCIUM 8.1*  --  8.6    Studies/Results: Ct Head Wo Contrast  04/18/2013   CLINICAL DATA:  Epidural hematoma.  Head injury.  EXAM: CT HEAD WITHOUT CONTRAST  TECHNIQUE: Contiguous axial images were obtained from the base of the skull through the vertex without intravenous contrast.  COMPARISON:  CT head without contrast 04/17/2013  FINDINGS: There is no significant change in the appearance of a comminuted depressed left frontoparietal skull fracture. A small extra-axial hematoma is stable, measuring 5 mm. A more prominent extracranial hematoma and is similar as well. Previously seen pneumocephalus is improved.  No significant midline shift is present. No new hemorrhage is evident. No cortical infarct or mass lesion is evident. The ventricles are of normal size.  A fracture is again seen extending across the left sphenoid bone into a left ethmoid air cell blood products are again noted within and the posterior left ethmoid air cell. A high-density collection within the right sphenoid also likely represents blood products. This raises concern for an occult fracture of the right sphenoid. The  previously noted posterior left zygomatic arch fracture is again seen.  IMPRESSION: 1. Stable appearance of small extra-axial hemorrhage subjacent to a comminuted depressed left frontoparietal skull fracture. 2. Stable appearance of a prominent extracranial hematoma overlying the fracture. 3. Skullbase fractures including the left zygomatic arch and bilateral sphenoid bones, left greater than right. A presumed occult right sphenoid fracture is suggested by a hemorrhage within the right sphenoid sinus. 4. Posterior left ethmoid fracture with blood products in the most posterior left ethmoid air cell.   Electronically Signed   By: Gennette Pachris  Mattern M.D.   On: 04/18/2013 09:04    Assessment/Plan: Ok for D/C, will need f/u CT in 3-4 weeks     LOS: 2 days     Clydene FakeHIRSCH,Jareli Highland R, MD 04/19/2013, 2:22 PM
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
## 2013-04-19 NOTE — Progress Notes (Signed)
Patient d/c to home with HHPT.  IV removed, prescriptions given and instructions reviewed.
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
## 2013-04-19 NOTE — Discharge Summary (Signed)
Kaitrin Seybold, MD, MPH, FACS Pager: [phone]  
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
## 2013-04-27 ENCOUNTER — Other Ambulatory Visit: Payer: Self-pay | Admitting: Neurosurgery

## 2013-04-27 ENCOUNTER — Telehealth (INDEPENDENT_AMBULATORY_CARE_PROVIDER_SITE_OTHER): Payer: Self-pay | Admitting: Orthopedic Surgery

## 2013-04-27 DIAGNOSIS — T148XXA Other injury of unspecified body region, initial encounter: Secondary | ICD-10-CM

## 2013-04-27 MED ORDER — OXYCODONE-ACETAMINOPHEN 5-325 MG PO TABS: 1.0000 | ORAL_TABLET | ORAL | Status: AC | PRN

## 2013-04-27 NOTE — Telephone Encounter (Signed)
Mom called to get refill on pain medicine to get him through to follow-up next week. I agreed to prescribe a limited amount but suggested she call NS office (as his HA's are the main source of pain and worsening) for any further refills.

## 2013-04-28 ENCOUNTER — Ambulatory Visit
Admission: RE | Admit: 2013-04-28 | Discharge: 2013-04-28 | Disposition: A | Discharge status: Home / Self Care | Payer: No Typology Code available for payment source | Source: Ambulatory Visit | Attending: Neurosurgery | Admitting: Neurosurgery

## 2013-04-28 DIAGNOSIS — T148XXA Other injury of unspecified body region, initial encounter: Secondary | ICD-10-CM

## 2013-05-09 ENCOUNTER — Other Ambulatory Visit: Payer: Self-pay | Admitting: Neurosurgery

## 2013-05-09 DIAGNOSIS — S064X9A Epidural hemorrhage with loss of consciousness of unspecified duration, initial encounter: Secondary | ICD-10-CM

## 2013-05-09 DIAGNOSIS — S064XAA Epidural hemorrhage with loss of consciousness status unknown, initial encounter: Secondary | ICD-10-CM

## 2013-05-10 ENCOUNTER — Ambulatory Visit
Admission: RE | Admit: 2013-05-10 | Discharge: 2013-05-10 | Disposition: A | Discharge status: Home / Self Care | Payer: No Typology Code available for payment source | Source: Ambulatory Visit | Attending: Neurosurgery | Admitting: Neurosurgery

## 2013-05-10 DIAGNOSIS — S064X9A Epidural hemorrhage with loss of consciousness of unspecified duration, initial encounter: Secondary | ICD-10-CM

## 2013-05-10 DIAGNOSIS — S064XAA Epidural hemorrhage with loss of consciousness status unknown, initial encounter: Secondary | ICD-10-CM
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
## 2015-03-24 IMAGING — CT CT HEAD W/O CM
2 series · 16 of 30 positions shown, 18 images · non-contrast
Comparison: 04/18/2013

CLINICAL DATA: Follow-up subdural hematoma

EXAM:
CT HEAD WITHOUT CONTRAST
TECHNIQUE: Contiguous axial images were obtained from the base of the skull
through the vertex without intravenous contrast.

[Series 3: head bone · axial · 0.49mm/px · z∈[+25,+152]mm · 8 of 64 slices shown]
[im 7/64  bone]
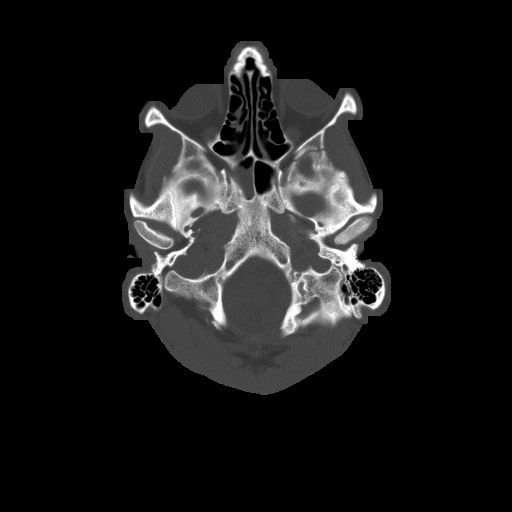
[im 14/64  bone]
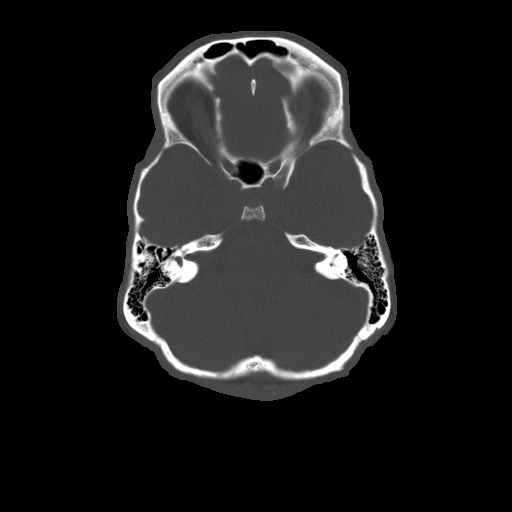
[im 20/64  bone]
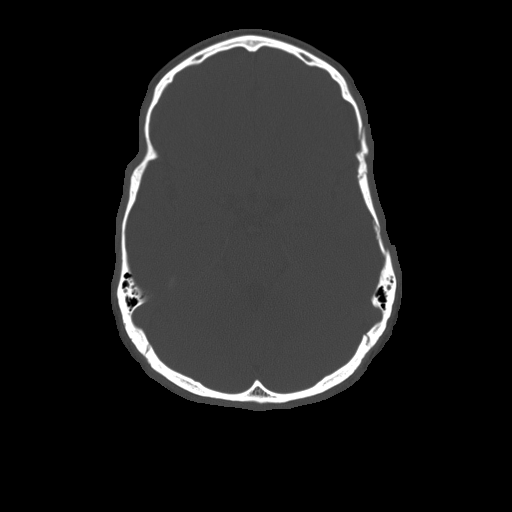
[im 27/64  bone]
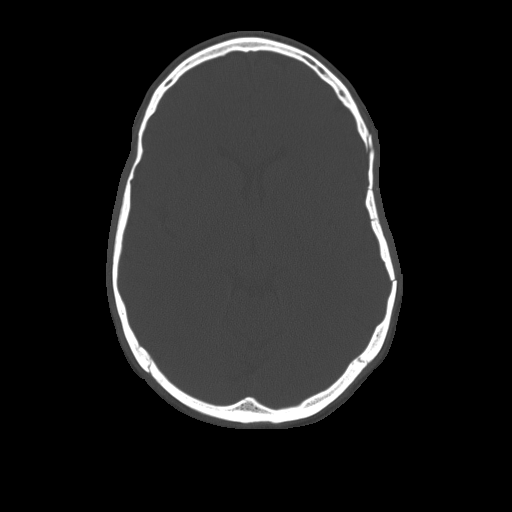
[im 37/64  bone]
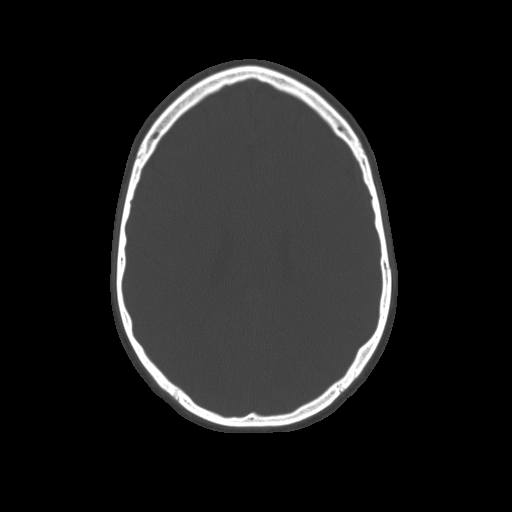
[im 44/64  bone]
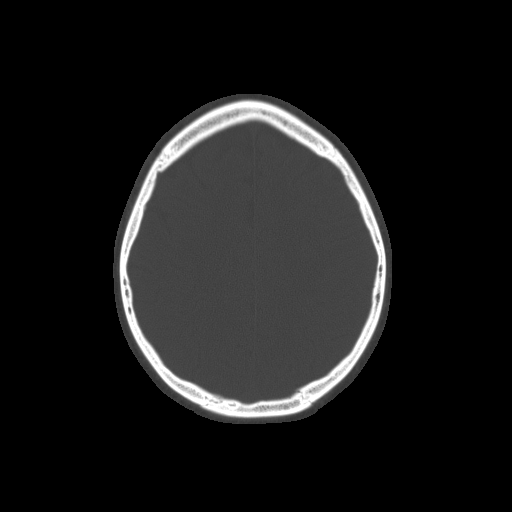
[im 50/64  bone]
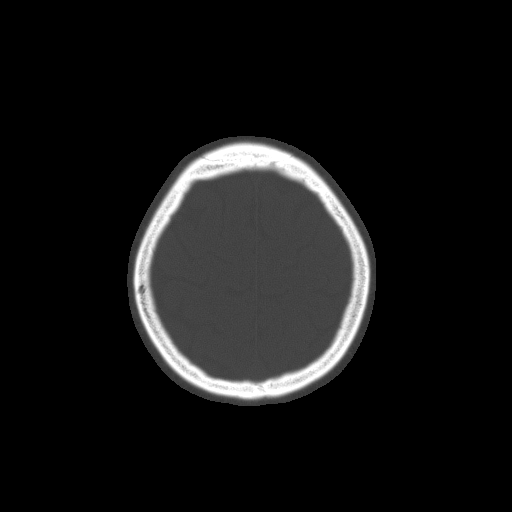
[im 57/64  bone]
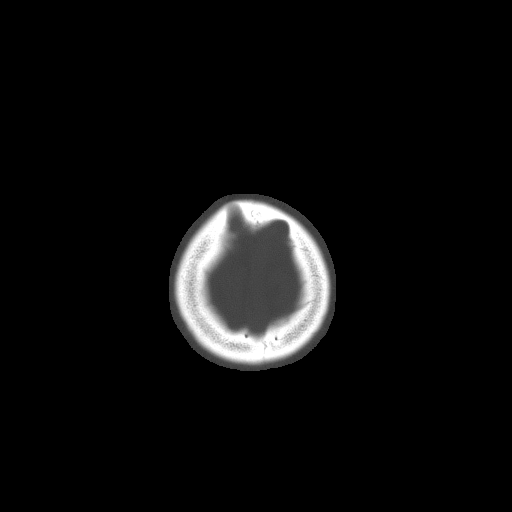

[Series 32: 3d filtered head w/o · axial · non-contrast · 0.49mm/px · z∈[+26,+149]mm · 8 of 32 slices shown, 10 images]
[im 4/32  brain]
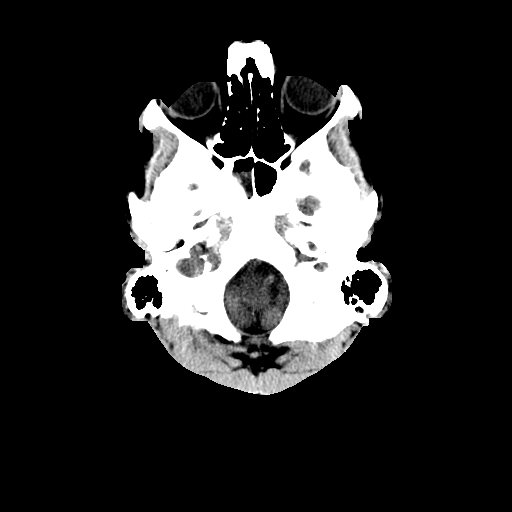
[im 4/32  bone]
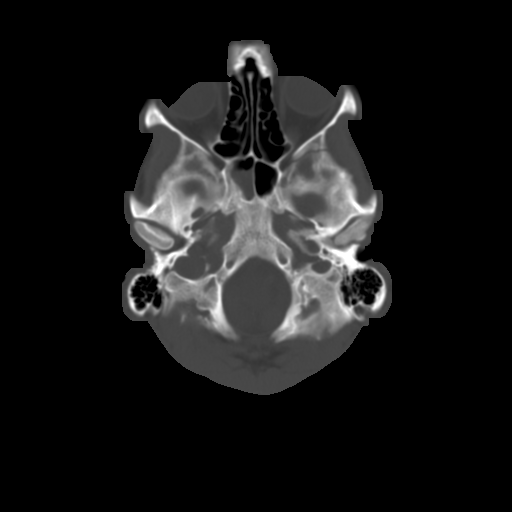
[im 7/32  brain]
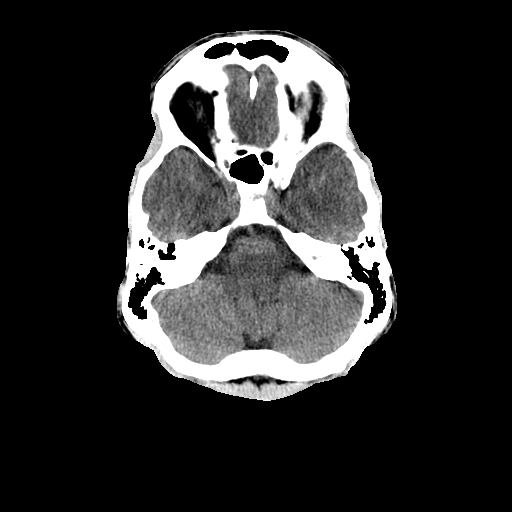
[im 11/32  brain]
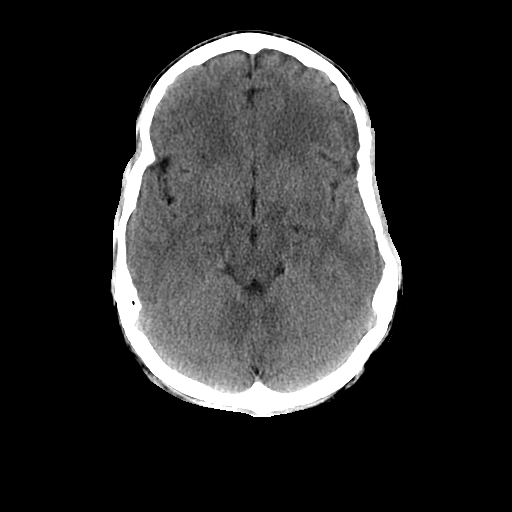
[im 14/32  brain]
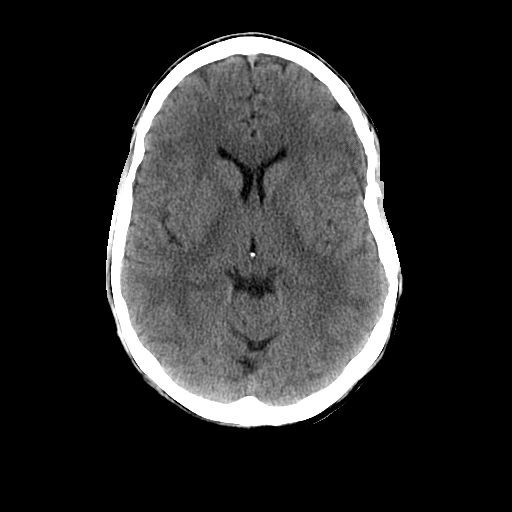
[im 18/32  brain]
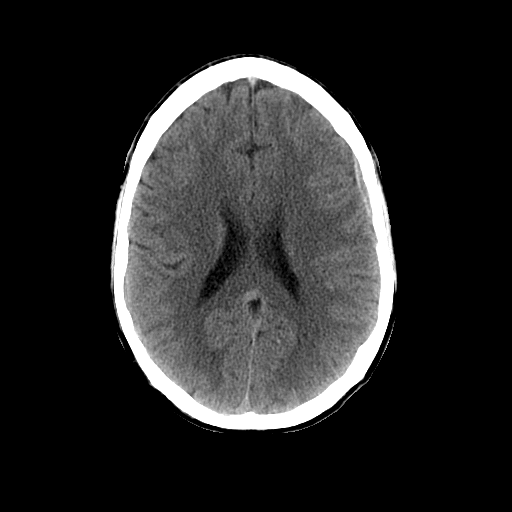
[im 18/32  bone]
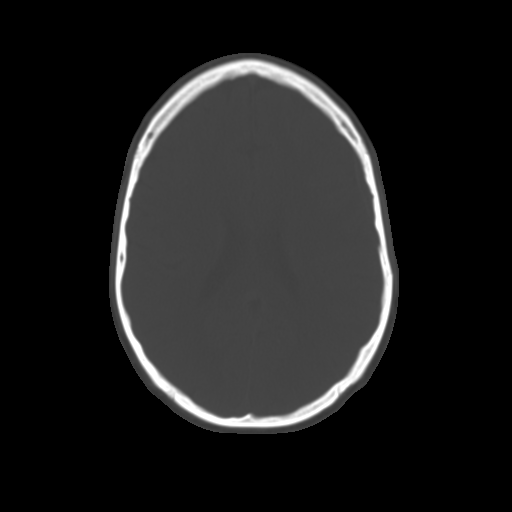
[im 21/32  brain]
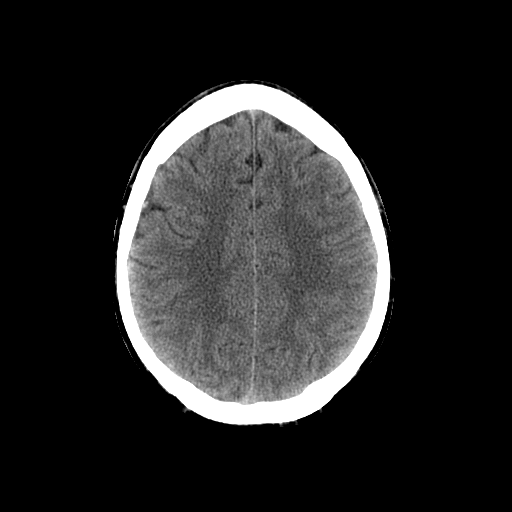
[im 25/32  brain]
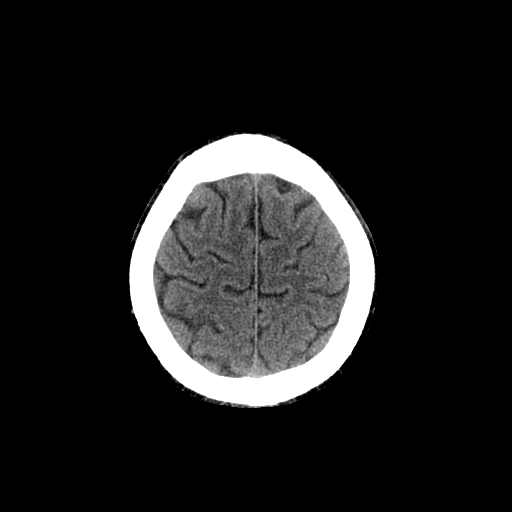
[im 28/32  brain]
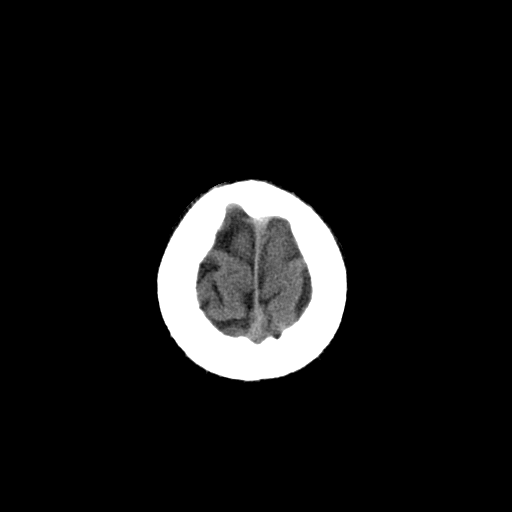

[16 of 30 positions shown; findings below may reference images not displayed]

FINDINGS: Comminuted, depressed left frontoparietal skull fracture, unchanged.
Underlying extra-axial collection measures 4 mm in thickness,
minimally decreased, with evolution of blood products. Overlying
extracranial hematoma has essentially resolved.

Associated fractures involving the left sphenoid wing (series 3/
image 7) and posterior left zygomatic arch (series 3/ image 5).

Layering hemorrhage in the right sphenoid sinus (series 32/ image
5), decreased. An occult fracture of the right sphenoid remains
possible.

No mass lesion, mass effect, or midline shift.

No CT evidence of acute infarction.

Cerebral volume is within normal limits. No ventriculomegaly.

The visualized paranasal sinuses and mastoid air cells are otherwise
clear.
IMPRESSION: Comminuted, depressed left frontoparietal skull fracture. Underlying
4 mm extra-axial collection, minimally decreased. Overlying
extracranial hematoma has essentially resolved.

Associated fractures involving the left sphenoid wing and posterior
left zygomatic arch. Occult right sphenoid fracture remains
possible.

Otherwise unchanged.
